# Patient Record
Sex: Female | Born: 1960 | Race: Black or African American | Hispanic: No | Marital: Single | Smoking: Never smoker
Health system: Southern US, Community
[De-identification: ages and names within clinical notes are randomized; demographics above are authoritative.]

## PROBLEM LIST (undated history)

## (undated) DIAGNOSIS — I639 Cerebral infarction, unspecified: Secondary | ICD-10-CM

## (undated) DIAGNOSIS — E119 Type 2 diabetes mellitus without complications: Secondary | ICD-10-CM

## (undated) DIAGNOSIS — I10 Essential (primary) hypertension: Secondary | ICD-10-CM

## (undated) HISTORY — DX: Type 2 diabetes mellitus without complications: E11.9

---

## 2010-02-15 ENCOUNTER — Emergency Department: Payer: Self-pay | Admitting: Emergency Medicine
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
## 2013-07-21 ENCOUNTER — Emergency Department: Payer: Self-pay | Admitting: Emergency Medicine

## 2017-05-14 ENCOUNTER — Other Ambulatory Visit: Payer: Self-pay | Admitting: Nurse Practitioner

## 2017-05-14 DIAGNOSIS — Z1289 Encounter for screening for malignant neoplasm of other sites: Secondary | ICD-10-CM

## 2017-09-22 ENCOUNTER — Emergency Department (HOSPITAL_COMMUNITY)
Admission: EM | Admit: 2017-09-22 | Discharge: 2017-09-22 | Disposition: A | Discharge status: Home / Self Care | Payer: BLUE CROSS/BLUE SHIELD | Attending: Emergency Medicine | Admitting: Emergency Medicine

## 2017-09-22 ENCOUNTER — Encounter (HOSPITAL_COMMUNITY): Payer: Self-pay | Admitting: Emergency Medicine

## 2017-09-22 DIAGNOSIS — K6289 Other specified diseases of anus and rectum: Secondary | ICD-10-CM | POA: Diagnosis present

## 2017-09-22 DIAGNOSIS — I10 Essential (primary) hypertension: Secondary | ICD-10-CM | POA: Insufficient documentation

## 2017-09-22 DIAGNOSIS — Z79899 Other long term (current) drug therapy: Secondary | ICD-10-CM | POA: Diagnosis not present

## 2017-09-22 DIAGNOSIS — K5641 Fecal impaction: Secondary | ICD-10-CM | POA: Diagnosis not present

## 2017-09-22 DIAGNOSIS — K644 Residual hemorrhoidal skin tags: Secondary | ICD-10-CM | POA: Insufficient documentation

## 2017-09-22 DIAGNOSIS — E119 Type 2 diabetes mellitus without complications: Secondary | ICD-10-CM | POA: Insufficient documentation

## 2017-09-22 HISTORY — DX: Essential (primary) hypertension: I10

## 2017-09-22 HISTORY — DX: Cerebral infarction, unspecified: I63.9

## 2017-09-22 LAB — BASIC METABOLIC PANEL
Anion gap: 12 (ref 5–15)
BUN: 15 mg/dL (ref 6–20)
CHLORIDE: 98 mmol/L — AB (ref 101–111)
CO2: 28 mmol/L (ref 22–32)
Calcium: 9.8 mg/dL (ref 8.9–10.3)
Creatinine, Ser: 0.7 mg/dL (ref 0.44–1.00)
GFR calc Af Amer: >60 mL/min (ref >60)
GFR calc non Af Amer: >60 mL/min (ref >60)
GLUCOSE: 330 mg/dL — AB (ref 65–99)
Potassium: 3.4 mmol/L — ABNORMAL LOW (ref 3.5–5.1)
Sodium: 138 mmol/L (ref 135–145)

## 2017-09-22 MED ORDER — METFORMIN HCL 500 MG PO TABS: 500.0000 mg | ORAL_TABLET | Freq: Two times a day (BID) | ORAL | 0 refills | Status: AC

## 2017-09-22 MED ORDER — HYDROCORTISONE ACETATE 25 MG RE SUPP: 25.0000 mg | Freq: Two times a day (BID) | RECTAL | 0 refills | Status: AC

## 2017-09-22 MED ORDER — DOCUSATE SODIUM 100 MG PO CAPS: 100.0000 mg | ORAL_CAPSULE | Freq: Two times a day (BID) | ORAL | 0 refills | Status: AC

## 2017-09-22 MED ORDER — BISACODYL 10 MG RE SUPP
10.0000 mg | Freq: Once | RECTAL | Status: AC
  Administered 2017-09-22: 10 mg via RECTAL
  Filled 2017-09-22: qty 1

## 2017-09-22 MED ORDER — METFORMIN HCL 500 MG PO TABS
500.0000 mg | ORAL_TABLET | Freq: Once | ORAL | Status: AC
  Administered 2017-09-22: 500 mg via ORAL
  Filled 2017-09-22: qty 1

## 2017-09-22 NOTE — ED Notes (Signed)
Scant return from enema  Continues on bedpan

## 2017-09-22 NOTE — ED Notes (Signed)
JI in to reassess and discuss findings and plan of care

## 2017-09-22 NOTE — ED Notes (Signed)
Awaiting discharge instructions.

## 2017-09-22 NOTE — ED Notes (Signed)
Pt education Bowel education/constipation prevention  Fiber Fluids OTC meds Mag citrate

## 2017-09-22 NOTE — ED Notes (Signed)
Pt reports in BR she had a small stool

## 2017-09-22 NOTE — ED Provider Notes (Signed)
[facility]PART[facility] Provider Note   CSN: [id number] Arrival date & time: 09/22/17  0908     History   Chief Complaint Chief Complaint  Patient presents with  . Hemorrhoids    HPI Helen Allison is a 56 y.o. female with a history of occasional hemorrhoids which she treats with Preparation H and occasional constipation, treated with as needed stool softeners presenting with a 3-day history of rectal pain without BM times 3 days.  She has had an enlarged hemorrhoid which has improved with Preparation H use but is still present.  She denies nausea or vomiting, abdominal pain, distention, fevers or chills or rectal bleeding.  HPI  Past Medical History:  Diagnosis Date  . Hypertension   . Stroke [facility](HCC)     There are no active problems to display for this patient.   History reviewed. No pertinent surgical history.  OB History    No data available       Home Medications    Prior to Admission medications   Medication Sig Start Date End Date Taking? Authorizing Provider  hydrochlorothiazide (HYDRODIURIL) 25 MG tablet Take 25 mg by mouth daily.   Yes [provider]  docusate sodium (COLACE) 100 MG capsule Take 1 capsule (100 mg total) by mouth every 12 (twelve) hours. 09/22/17   Burgess AmorIdol, Creola Krotz, PA-C  hydrocortisone (ANUSOL-HC) 25 MG suppository Place 1 suppository (25 mg total) rectally 2 (two) times daily. 09/22/17   Burgess AmorIdol, Roschelle Calandra, PA-C  metFORMIN (GLUCOPHAGE) 500 MG tablet Take 1 tablet (500 mg total) by mouth 2 (two) times daily with a meal. 09/22/17   Victoriano LainIdol, Margery Szostak, PA-C    Family History History reviewed. No pertinent family history.  Social History Social History  Substance Use Topics  . Smoking status: Never Smoker  . Smokeless tobacco: Not on file  . Alcohol use No     Allergies   Sulfa antibiotics   Review of Systems Review of Systems  Constitutional: Negative for fever.  HENT: Negative for congestion and sore throat.   Eyes:  Negative.   Respiratory: Negative for chest tightness and shortness of breath.   Cardiovascular: Negative for chest pain.  Gastrointestinal: Positive for constipation. Negative for abdominal pain, anal bleeding, nausea and vomiting.       Negative except as mentioned in HPI.   Genitourinary: Negative.   Musculoskeletal: Negative for arthralgias, joint swelling and neck pain.  Skin: Negative.  Negative for rash and wound.  Neurological: Negative for dizziness, weakness, light-headedness, numbness and headaches.  Psychiatric/Behavioral: Negative.      Physical Exam Updated Vital Signs BP (!) 152/89 (BP Location: Left Arm)   Pulse (!) 106   Temp 98.7 F (37.1 C) (Oral)   Resp 16   Ht 5' (1.524 m)   Wt 84.4 kg (186 lb)   SpO2 100%   BMI 36.33 kg/m   Physical Exam  Constitutional: She appears well-developed and well-nourished.  HENT:  Head: Normocephalic and atraumatic.  Eyes: Conjunctivae are normal.  Neck: Normal range of motion.  Cardiovascular: Normal rate, regular rhythm, normal heart sounds and intact distal pulses.   Pulmonary/Chest: Effort normal and breath sounds normal. She has no wheezes.  Abdominal: Soft. Bowel sounds are normal. There is no tenderness.  Genitourinary:  Genitourinary Comments: Small, soft, nontender external hemorrhoid, no thrombosis.  Moderate stool impaction in rectal vault.  Musculoskeletal: Normal range of motion.  Neurological: She is alert.  Skin: Skin is warm and dry.  Psychiatric: She has a normal  mood and affect.  Nursing note and vitals reviewed.    ED Treatments / Results  Labs (all labs ordered are listed, but only abnormal results are displayed) Results for orders placed or performed during the hospital encounter of 09/22/17  Basic metabolic panel  Result Value Ref Range   Sodium 138 135 - 145 mmol/L   Potassium 3.4 (L) 3.5 - 5.1 mmol/L   Chloride 98 (L) 101 - 111 mmol/L   CO2 28 22 - 32 mmol/L   Glucose, Bld 330 (H) 65 - 99  mg/dL   BUN 15 6 - 20 mg/dL   Creatinine, Ser 1.61 0.44 - 1.00 mg/dL   Calcium 9.8 8.9 - 09.6 mg/dL   GFR calc non Af Amer >60 >60 mL/min   GFR calc Af Amer >60 >60 mL/min   Anion gap 12 5 - 15   No results found.   EKG  EKG Interpretation None       Radiology No results found.  Procedures Procedures (including critical care time)  Medications Ordered in ED Medications  bisacodyl (DULCOLAX) suppository 10 mg (10 mg Rectal Given 09/22/17 1007)  metFORMIN (GLUCOPHAGE) tablet 500 mg (500 mg Oral Given 09/22/17 1220)     Initial Impression / Assessment and Plan / ED Course  I have reviewed the triage vital signs and the nursing notes.  Pertinent labs & imaging results that were available during my care of the patient were reviewed by me and considered in my medical decision making (see chart for details).     Dulcolax suppository given with no BM. Fleets enema applied with passage of moderate hard stool.  Recheck with no further impaction. Still with discomfort directly at the hemorrhoid only.  No thrombosis. Pt with new onset DM.  She does endorse being told her cbg's have been elevated in the past but has not followed up on this.  She was strongly advised she needs a recheck this week with her pcp. Metformin, which may also help with her chronic constipation. Anusol, colace prn. Discussed warm tub soaks.    Final Clinical Impressions(s) / ED Diagnoses   Final diagnoses:  External hemorrhoid  Fecal impaction (HCC)  New onset type 2 diabetes mellitus (HCC)    New Prescriptions Discharge Medication List as of 09/22/2017  2:32 PM    START taking these medications   Details  docusate sodium (COLACE) 100 MG capsule Take 1 capsule (100 mg total) by mouth every 12 (twelve) hours., Starting Sat 09/22/2017, Print    hydrocortisone (ANUSOL-HC) 25 MG suppository Place 1 suppository (25 mg total) rectally 2 (two) times daily., Starting Sat 09/22/2017, Print    metFORMIN  (GLUCOPHAGE) 500 MG tablet Take 1 tablet (500 mg total) by mouth 2 (two) times daily with a meal., Starting Sat 09/22/2017, Print         Victoriano Lain 09/22/17 1451    Azalia Bilis, MD 09/22/17 1510

## 2017-09-22 NOTE — Discharge Instructions (Signed)
As discussed start taking your metformin prescription to help get your blood glucose levels under better control.  This medication may loosen your stools which may be helpful with your chronic constipation.  I also prescribed to Colace to use if needed in order to keep your stools soft as this hemorrhoid is healing. Use warm soaks 15 minutes several times daily with gentle pressure application to the hemorrhoid as discussed to help shrink it.  I have prescribed you a hemorrhoid suppository to apply after the warm soak.  Follow-up care doctor  with your doctor within a week for recheck of your symptoms.

## 2017-09-22 NOTE — ED Notes (Signed)
Out of bed to bathroom 

## 2017-09-22 NOTE — ED Notes (Addendum)
Pt given a enema, put on bed pan and given call bell awaiting results  Pt noted to have a small hemorhoid   She reports that she has never received had diabetic treatment before and will go home with a metformin rx Education regarding need to check her blood sugars daily and the need to purchase a maching to measure  As well as finding a physician to follow her diabetes for care and education

## 2017-09-22 NOTE — ED Triage Notes (Signed)
Pt reports severe hemorrhoid swelling with pain.  States she is unable to use the bathroom due to the swelling.  Denies rectal bleeding other than occasional bright red blood on tissue.

## 2017-09-22 NOTE — ED Notes (Signed)
Awaiting reassessment.  

## 2023-05-07 ENCOUNTER — Other Ambulatory Visit: Payer: Self-pay | Admitting: Family Medicine

## 2023-05-07 DIAGNOSIS — Z1231 Encounter for screening mammogram for malignant neoplasm of breast: Secondary | ICD-10-CM

## 2023-06-26 ENCOUNTER — Other Ambulatory Visit: Payer: Self-pay

## 2023-06-26 DIAGNOSIS — Z1231 Encounter for screening mammogram for malignant neoplasm of breast: Secondary | ICD-10-CM

## 2023-08-27 ENCOUNTER — Ambulatory Visit
Admission: RE | Admit: 2023-08-27 | Discharge: 2023-08-27 | Disposition: A | Discharge status: Home / Self Care | Payer: Self-pay | Source: Ambulatory Visit | Attending: Obstetrics and Gynecology | Admitting: Obstetrics and Gynecology

## 2023-08-27 ENCOUNTER — Ambulatory Visit: Payer: Self-pay | Attending: Hematology and Oncology | Admitting: Hematology and Oncology

## 2023-08-27 VITALS — BP 168/85 | Wt 178.4 lb

## 2023-08-27 DIAGNOSIS — Z1231 Encounter for screening mammogram for malignant neoplasm of breast: Secondary | ICD-10-CM

## 2023-08-27 NOTE — Patient Instructions (Signed)
Taught Helen Allison about self breast awareness and gave educational materials to take home. Patient did not need a Pap smear today due to last Pap smear was in 06/04/2023 per patient.  Let her know [facility] will cover Pap smears every 5 years unless has a history of abnormal Pap smears. Referred patient to the [facility] for screening mammogram. Appointment scheduled for 08/27/23. Patient aware of appointment and will be there. Let patient know will follow up with her within the next couple weeks with results. Helen Allison verbalized understanding.  Pascal Lux, NP 1:01 PM

## 2023-08-27 NOTE — Progress Notes (Unsigned)
[name] is a 62 y.o. female who presents to [facility] clinic today with no complaints.    Pap Smear: Pap not smear completed today. Last Pap smear was 06/04/2023 at [facility]  clinic and was normal. Per patient has no history of an abnormal Pap smear. Last Pap smear result is available in Epic.   Physical exam: Breasts Breasts symmetrical. No skin abnormalities bilateral breasts. No nipple retraction bilateral breasts. No nipple discharge bilateral breasts. No lymphadenopathy. No lumps palpated bilateral breasts.       Pelvic/Bimanual Pap is not indicated today    Smoking History: Patient has never smoked and was not referred to quit line.    Patient Navigation: Patient education provided. Access to services provided for patient through [facility] program. No interpreter provided. No transportation provided   Colorectal Cancer Screening: Per patient has never had colonoscopy completed No complaints today.    Breast and Cervical Cancer Risk Assessment: Patient does not have family history of breast cancer, known genetic mutations, or radiation treatment to the chest before age 74. Patient does not have history of cervical dysplasia, immunocompromised, or DES exposure in-utero.   Risk Scores as of Encounter on 08/27/2023     Helen Allison           5-year 2.47%   Lifetime 10.25%            Last calculated by Helen Rutherford, LPN on 1/61/0960 at  1:09 PM         A: [facility] exam without pap smear No complaints with benign exam.   P: Referred patient to [facility] for a screening mammogram. Appointment scheduled 08/27/23.  Pascal Lux, NP 08/27/2023 12:58 PM
